# Patient Record
Sex: Male | Born: 1950 | Race: White | Hispanic: No | Marital: Married
Health system: Southern US, Community
[De-identification: ages and names within clinical notes are randomized; demographics above are authoritative.]

---

## 1999-09-30 ENCOUNTER — Emergency Department (HOSPITAL_COMMUNITY): Admission: EM | Admit: 1999-09-30 | Discharge: 1999-09-30 | Payer: Self-pay | Admitting: Emergency Medicine

## 1999-09-30 ENCOUNTER — Encounter: Payer: Self-pay | Admitting: Emergency Medicine

## 2002-02-01 ENCOUNTER — Encounter: Admission: RE | Admit: 2002-02-01 | Discharge: 2002-02-01 | Payer: Self-pay | Admitting: Internal Medicine

## 2002-02-01 ENCOUNTER — Encounter: Payer: Self-pay | Admitting: Internal Medicine

## 2010-02-15 ENCOUNTER — Ambulatory Visit (HOSPITAL_COMMUNITY)
Admission: RE | Admit: 2010-02-15 | Discharge: 2010-02-15 | Payer: Self-pay | Source: Home / Self Care | Attending: Urology | Admitting: Urology

## 2010-02-16 LAB — CREATININE, SERUM
Creatinine, Ser: 0.99 mg/dL (ref 0.4–1.5)
GFR calc Af Amer: >60 mL/min (ref >60)
GFR calc non Af Amer: >60 mL/min (ref >60)

## 2010-03-02 ENCOUNTER — Ambulatory Visit: Payer: Managed Care, Other (non HMO) | Attending: Urology | Admitting: Physical Therapy

## 2010-03-02 DIAGNOSIS — M2420 Disorder of ligament, unspecified site: Secondary | ICD-10-CM | POA: Insufficient documentation

## 2010-03-02 DIAGNOSIS — IMO0001 Reserved for inherently not codable concepts without codable children: Secondary | ICD-10-CM | POA: Insufficient documentation

## 2010-03-02 DIAGNOSIS — M629 Disorder of muscle, unspecified: Secondary | ICD-10-CM | POA: Insufficient documentation

## 2010-03-17 ENCOUNTER — Other Ambulatory Visit: Payer: Self-pay | Admitting: Urology

## 2010-03-17 ENCOUNTER — Other Ambulatory Visit (HOSPITAL_COMMUNITY): Payer: Self-pay | Admitting: Urology

## 2010-03-17 ENCOUNTER — Encounter (HOSPITAL_COMMUNITY): Payer: Managed Care, Other (non HMO)

## 2010-03-17 ENCOUNTER — Ambulatory Visit (HOSPITAL_COMMUNITY)
Admission: RE | Admit: 2010-03-17 | Discharge: 2010-03-17 | Disposition: A | Discharge status: Home / Self Care | Payer: Managed Care, Other (non HMO) | Source: Ambulatory Visit | Attending: Urology | Admitting: Urology

## 2010-03-17 DIAGNOSIS — Z01810 Encounter for preprocedural cardiovascular examination: Secondary | ICD-10-CM | POA: Insufficient documentation

## 2010-03-17 DIAGNOSIS — Z01812 Encounter for preprocedural laboratory examination: Secondary | ICD-10-CM | POA: Insufficient documentation

## 2010-03-17 DIAGNOSIS — Z01811 Encounter for preprocedural respiratory examination: Secondary | ICD-10-CM | POA: Insufficient documentation

## 2010-03-17 DIAGNOSIS — Z01818 Encounter for other preprocedural examination: Secondary | ICD-10-CM | POA: Insufficient documentation

## 2010-03-17 DIAGNOSIS — I440 Atrioventricular block, first degree: Secondary | ICD-10-CM | POA: Insufficient documentation

## 2010-03-17 DIAGNOSIS — C61 Malignant neoplasm of prostate: Secondary | ICD-10-CM

## 2010-03-17 LAB — CBC
MCH: 30.6 pg (ref 26.0–34.0)
MCHC: 34.7 g/dL (ref 30.0–36.0)
Platelets: 270 10*3/uL (ref 150–400)
RBC: 5.2 MIL/uL (ref 4.22–5.81)

## 2010-03-17 LAB — BASIC METABOLIC PANEL
Calcium: 9.6 mg/dL (ref 8.4–10.5)
Creatinine, Ser: 0.81 mg/dL (ref 0.4–1.5)
GFR calc Af Amer: >60 mL/min (ref >60)

## 2010-03-17 LAB — SURGICAL PCR SCREEN
MRSA, PCR: NEGATIVE
Staphylococcus aureus: NEGATIVE

## 2010-03-22 ENCOUNTER — Other Ambulatory Visit: Payer: Self-pay | Admitting: Urology

## 2010-03-22 ENCOUNTER — Inpatient Hospital Stay (HOSPITAL_COMMUNITY)
Admission: RE | Admit: 2010-03-22 | Discharge: 2010-03-23 | DRG: 708 | Disposition: A | Discharge status: Home / Self Care | Payer: Managed Care, Other (non HMO) | Source: Ambulatory Visit | Attending: Urology | Admitting: Urology

## 2010-03-22 DIAGNOSIS — I10 Essential (primary) hypertension: Secondary | ICD-10-CM | POA: Diagnosis present

## 2010-03-22 DIAGNOSIS — C61 Malignant neoplasm of prostate: Principal | ICD-10-CM | POA: Diagnosis present

## 2010-03-22 DIAGNOSIS — Z970 Presence of artificial eye: Secondary | ICD-10-CM

## 2010-03-23 LAB — HEMOGLOBIN AND HEMATOCRIT, BLOOD
HCT: 41.9 % (ref 39.0–52.0)
Hemoglobin: 13.7 g/dL (ref 13.0–17.0)

## 2010-03-27 NOTE — Op Note (Signed)
Nicholas Poole, Nicholas Poole              ACCOUNT NO.:  [id number]  MEDICAL RECORD NO.:  [id number]           PATIENT TYPE:  I  LOCATION:  0009                         FACILITY:  [facility]  PHYSICIAN:  Heloise Purpura, MD      DATE OF BIRTH:  Nov 08, 1950  DATE OF PROCEDURE:  03/22/2010 DATE OF DISCHARGE:                              OPERATIVE REPORT   PREOPERATIVE DIAGNOSIS:  Clinically localized adenocarcinoma of prostate (clinical stage T2a, N0, M0).  POSTOPERATIVE DIAGNOSIS:  Clinically localized adenocarcinoma of prostate (clinical stage T2a, N0, M0).  PROCEDURE: 1. Robotic-assisted laparoscopic radical prostatectomy (left nerve     sparing and partial right nerve sparing). 2. Bilateral robotic-assisted laparoscopic pelvic lymphadenectomy.  SURGEON:  Heloise Purpura, MD  ASSISTANT:  Delia Chimes, NP  ANESTHESIA:  General.  COMPLICATIONS:  None.  ESTIMATED BLOOD LOSS:  100 cc.  INTRAVENOUS FLUIDS:  1500 cc of lactated Ringer's.  SPECIMENS: 1. Prostate and seminal vesicles. 2. Right pelvic lymph nodes. 3. Left pelvic lymph nodes.  DISPOSITION:  Specimen to pathology.  DRAINS: 1. A 20-French Coude catheter. 2. A #19 Blake pelvic drain.  INDICATIONS:  Mr. Choquette is a 60 year old gentleman with clinically localized prostate cancer.  After discussion regarding management options for treatment, he elected to proceed with surgical therapy and the above procedures.  The potential risks, complications, and alternative treatment options were discussed in detail and informed consent was obtained.  DESCRIPTION OF PROCEDURE:  He was taken to the operating room and a general anesthetic was administered.  He was given preoperative antibiotics, placed in the dorsal lithotomy position, and prepped and draped in the usual sterile fashion.  Next preoperative time-out was performed.  A Foley catheter was then inserted into the bladder and a site was selected just to the left of the  umbilicus for placement of the camera port.  This was placed using a standard open Hassan technique which allowed entry into the peritoneal cavity under direct vision without difficulty.  A 12-mm port was then placed and pneumoperitoneum was established.  The abdomen was then inspected and there was no evidence of any intra-abdominal injuries or other abnormalities.  The remaining ports were then placed with 8 mm robotic ports placed in the right lower quadrant, left lower quadrant, and far left lower quadrant. A 5-mm port was placed in the right upper quadrant and a 12-mm port was placed in the far right lateral abdominal wall for laparoscopic assistance.  All ports were placed under direct vision without difficulty.  The surgical cart was then docked.  With the aid of the cautery scissors, the bladder was reflected posteriorly allowing entry into space of Retzius and identification of the endopelvic fascia and prostate.  The endopelvic fascia was incised from the apex back to the base of prostate bilaterally and the underlying levator muscle fibers were swept laterally off the prostate thereby isolating the dorsal venous complex.  The dorsal vein was then stapled and divided with a 45- mm flex echelon stapler.  The bladder neck was then identified with the aid of Foley catheter manipulation and was divided anteriorly.  This exposed the catheter.  The catheter balloon was deflated and the catheter was brought into the operative field and used to retract the prostate anteriorly.  The posterior bladder neck was then divided and dissection proceeded between the bladder and prostate until the vasa deferentia and seminal vesicles were identified.  The vasa deferentia were isolated, divided and lifted anteriorly.  The seminal vesicles were then dissected down to their tips with care to control the seminal vesicle arterial blood supply.  The seminal vesicles were then also lifted anteriorly in  the space between Denonvilliers fascia and the anterior rectum was developed with a combination of sharp and blunt dissection.  This isolated the vascular pedicles of the prostate.  The lateral prostatic fascia was then sharply incised bilaterally allowing the neurovascular bundles to be released.  The vascular pedicles of the prostate were then ligated with Hem-o-lok clips and divided above at the level of the neurovascular bundles with sharp cold scissor dissection. The neurovascular bundles were then swept off the apex of the prostate urethra and the urethra was sharply transected allowing the prostate specimen to be disarticulated.  The pelvis was then copiously irrigated and hemostasis was ensured.  There was no evidence of a rectal injury. There was noted be some arterial bleeding near the distal right neurovascular bundle and this was controlled with figure-of-eight 3-0 Vicryl sutures placed and to provide hemostasis.  Attention then turned to the right pelvic sidewall.  The fibrofatty tissue between the external iliac vein, confluence of the iliac vessels, hypogastric artery, and Cooper's ligament was dissected free from the pelvic sidewall with care to preserve the obturator nerve.  Hem-o-lok clips were used for lymphostasis and hemostasis.  This lymphatic packet was then removed for permanent pathologic analysis and an identical procedure was performed on the contralateral side.  Attention then turned to the urethral anastomosis.  A 2-0 Vicryl slip-knot was placed between Denonvilliers fascia, the posterior bladder neck, and the posterior urethra to reapproximate these structures.  A double-armed 3-0 Monocryl suture was then used to perform a 360-degree running tension- free anastomosis between the bladder neck and urethra.  A new 20-French Coude catheter was inserted into the bladder and irrigated.  There were no blood clots within the bladder and the anastomosis appeared to  be watertight.  A #19 Blake drain was then brought through the left robotic port and positioned appropriately in the pelvis.  It was secured to skin with a nylon suture.  The surgical cart was then undocked.  The right lateral 12-mm port site was then closed with a 0 Vicryl suture placed laparoscopically.  All remaining ports were removed under direct vision and the prostate specimen was removed intact within the Endopouch retrieval bag via the periumbilical port site.  This fascial opening was then closed with 2 running 0 Vicryl sutures.  All port sites were injected with 0.25% Marcaine and reapproximated at the skin level with staples.  Sterile dressings were applied.  The patient appeared to tolerate the procedure well and without complications.  He was able to be extubated, transferred to the recovery unit in satisfactory condition.     Heloise Purpura, MD     LB/MEDQ  D:  03/22/2010  T:  03/22/2010  Job:  [id number]  Electronically Signed by Heloise Purpura MD on 03/27/2010 08:03:56 AM

## 2010-04-15 ENCOUNTER — Ambulatory Visit: Payer: Managed Care, Other (non HMO) | Attending: Urology | Admitting: Physical Therapy

## 2010-04-15 DIAGNOSIS — IMO0001 Reserved for inherently not codable concepts without codable children: Secondary | ICD-10-CM | POA: Insufficient documentation

## 2010-04-15 DIAGNOSIS — M2420 Disorder of ligament, unspecified site: Secondary | ICD-10-CM | POA: Insufficient documentation

## 2010-04-15 DIAGNOSIS — M629 Disorder of muscle, unspecified: Secondary | ICD-10-CM | POA: Insufficient documentation

## 2010-04-22 ENCOUNTER — Ambulatory Visit: Payer: Managed Care, Other (non HMO) | Admitting: Physical Therapy

## 2010-05-20 ENCOUNTER — Ambulatory Visit: Payer: Managed Care, Other (non HMO) | Attending: Urology | Admitting: Physical Therapy

## 2010-05-20 DIAGNOSIS — M629 Disorder of muscle, unspecified: Secondary | ICD-10-CM | POA: Insufficient documentation

## 2010-05-20 DIAGNOSIS — M2420 Disorder of ligament, unspecified site: Secondary | ICD-10-CM | POA: Insufficient documentation

## 2010-05-20 DIAGNOSIS — IMO0001 Reserved for inherently not codable concepts without codable children: Secondary | ICD-10-CM | POA: Insufficient documentation

## 2010-06-10 ENCOUNTER — Encounter: Payer: Managed Care, Other (non HMO) | Admitting: Physical Therapy

## 2010-06-11 NOTE — Consult Note (Signed)
[facility]  Patient:    Nicholas Poole, Nicholas Poole                       MRN: [id number] Proc. Date: 09/30/99 Attending:  Artist Pais. Mina Marble, M.D.                          Consultation Report  REFERRING PHYSICIAN:  Smitty Cords. Beverely Pace, M.D.  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  He is otherwise a fairly healthy 60 year old male.  He presents today with significant pain and deformity and wounds over the volar aspect of his left long and ring fingers.  ALLERGIES:  No known drug allergies.  MEDICATIONS:  Verapamil 240 slow release for hypertension, which is controlled quite well over the past few years.  PAST MEDICAL HISTORY:  Noncontributory.  PAST SURGICAL HISTORY:  Noncontributory.  FAMILY HISTORY:  Noncontributory.  SOCIAL HISTORY:  Noncontributory.  PHYSICAL EXAMINATION:  GENERAL:  A well-developed, well-nourished male, pleasant, alert and oriented x 3.  EXTREMITIES:  Examination of the hand on the left shows an open wound over the distal aspect of his long finger with loss of the nail matrix and the nail plate about the distal third, with exposed distal phalangeal bone.  Also has a fairly complex laceration to the volar aspect of the ring finger at the volar pad with no exposed bone or tendon.  There is a significant amount of foreign material, including grass, within both of the wounds.  He has a 2+ radial pulses, brisk capillary refill otherwise.  Skin is otherwise intact with no erythema or signs of infection.  He can make a positive fist.  Full tendon function.  No instability noted at the finger joints.  DIAGNOSTIC EVALUATION:  X-rays in the emergency department showed an open fracture of the distal  phalanx, left long finger.  IMPRESSION:  A 60 year old male with open fracture, tip amputation of left long finger, contaminated wound left finger finger.  DESCRIPTION OF PROCEDURE:  The patient was anesthetized with 2% plain lidocaine.  Once adequate anesthesia was obtained, he was cleaned with Betadine soap and saline solution, and his wounds were debrided.  Once they were debrided, a volar V-Y advancement flap was advanced over the long finger after debridement of nonviable material, including grass and dirt.  The flap was advanced and was sewn to the nail plate using 4-0 nylon.  Once the V-Y advancement flap had been finished, the wound on the volar aspect of the ring finger was freed of clot and nonviable material, including dirt and grass. This was dressed with Xeroform for secondary intention healing.  Both wounds were then dressed with 4 x 4s, fluffs, and Coban wrap.  DISPOSITION:  The patient was then discharged from the emergency department with Keflex 500 mg one p.o. q.i.d. for a week for antibiotic prophylaxis, as well as Vicodin #20 for pain.  He was to follow up in my office on October 05, 1999. DD:  09/30/99 TD:  10/01/99 Job: [id number]

## 2010-09-30 ENCOUNTER — Other Ambulatory Visit: Payer: Self-pay | Admitting: Gastroenterology

## 2010-10-22 ENCOUNTER — Emergency Department (HOSPITAL_COMMUNITY)
Admission: EM | Admit: 2010-10-22 | Discharge: 2010-10-22 | Disposition: A | Discharge status: Home / Self Care | Payer: Managed Care, Other (non HMO) | Attending: Emergency Medicine | Admitting: Emergency Medicine

## 2010-10-22 ENCOUNTER — Emergency Department (HOSPITAL_COMMUNITY): Payer: Managed Care, Other (non HMO)

## 2010-10-22 DIAGNOSIS — S0990XA Unspecified injury of head, initial encounter: Secondary | ICD-10-CM | POA: Insufficient documentation

## 2010-10-22 DIAGNOSIS — Z8546 Personal history of malignant neoplasm of prostate: Secondary | ICD-10-CM | POA: Insufficient documentation

## 2010-10-22 DIAGNOSIS — I10 Essential (primary) hypertension: Secondary | ICD-10-CM | POA: Insufficient documentation

## 2010-10-22 DIAGNOSIS — IMO0002 Reserved for concepts with insufficient information to code with codable children: Secondary | ICD-10-CM | POA: Insufficient documentation

## 2010-10-22 DIAGNOSIS — M25519 Pain in unspecified shoulder: Secondary | ICD-10-CM | POA: Insufficient documentation

## 2010-10-22 DIAGNOSIS — S40019A Contusion of unspecified shoulder, initial encounter: Secondary | ICD-10-CM | POA: Insufficient documentation

## 2010-10-22 DIAGNOSIS — S0003XA Contusion of scalp, initial encounter: Secondary | ICD-10-CM | POA: Insufficient documentation

## 2010-12-22 ENCOUNTER — Other Ambulatory Visit: Payer: Self-pay | Admitting: Dermatology

## 2014-10-24 DIAGNOSIS — Z23 Encounter for immunization: Secondary | ICD-10-CM | POA: Diagnosis not present

## 2014-10-24 DIAGNOSIS — D075 Carcinoma in situ of prostate: Secondary | ICD-10-CM | POA: Diagnosis not present

## 2014-10-24 DIAGNOSIS — I10 Essential (primary) hypertension: Secondary | ICD-10-CM | POA: Diagnosis not present

## 2014-11-02 DIAGNOSIS — D122 Benign neoplasm of ascending colon: Secondary | ICD-10-CM | POA: Diagnosis not present

## 2014-11-06 DIAGNOSIS — Z86010 Personal history of colonic polyps: Secondary | ICD-10-CM | POA: Diagnosis not present

## 2014-11-06 DIAGNOSIS — D129 Benign neoplasm of anus and anal canal: Secondary | ICD-10-CM | POA: Diagnosis not present

## 2014-11-12 ENCOUNTER — Other Ambulatory Visit: Payer: Self-pay | Admitting: Gastroenterology

## 2014-11-12 DIAGNOSIS — Z09 Encounter for follow-up examination after completed treatment for conditions other than malignant neoplasm: Secondary | ICD-10-CM | POA: Diagnosis not present

## 2014-11-12 DIAGNOSIS — D122 Benign neoplasm of ascending colon: Secondary | ICD-10-CM | POA: Diagnosis not present

## 2014-11-12 DIAGNOSIS — Z86010 Personal history of colonic polyps: Secondary | ICD-10-CM | POA: Diagnosis not present

## 2014-11-12 DIAGNOSIS — K648 Other hemorrhoids: Secondary | ICD-10-CM | POA: Diagnosis not present

## 2015-02-08 DIAGNOSIS — J3089 Other allergic rhinitis: Secondary | ICD-10-CM | POA: Diagnosis not present

## 2015-02-08 DIAGNOSIS — J019 Acute sinusitis, unspecified: Secondary | ICD-10-CM | POA: Diagnosis not present

## 2015-02-12 DIAGNOSIS — J018 Other acute sinusitis: Secondary | ICD-10-CM | POA: Diagnosis not present

## 2015-02-13 DIAGNOSIS — C61 Malignant neoplasm of prostate: Secondary | ICD-10-CM | POA: Diagnosis not present

## 2015-02-20 DIAGNOSIS — Z0000 Encounter for general adult medical examination without abnormal findings: Secondary | ICD-10-CM | POA: Diagnosis not present

## 2015-02-20 DIAGNOSIS — C61 Malignant neoplasm of prostate: Secondary | ICD-10-CM | POA: Diagnosis not present

## 2015-09-15 DIAGNOSIS — Z4421 Encounter for fitting and adjustment of artificial right eye: Secondary | ICD-10-CM | POA: Diagnosis not present

## 2015-09-16 DIAGNOSIS — C61 Malignant neoplasm of prostate: Secondary | ICD-10-CM | POA: Diagnosis not present

## 2015-09-23 ENCOUNTER — Other Ambulatory Visit (HOSPITAL_COMMUNITY): Payer: Self-pay | Admitting: Urology

## 2015-09-23 DIAGNOSIS — C61 Malignant neoplasm of prostate: Secondary | ICD-10-CM | POA: Diagnosis not present

## 2015-09-30 ENCOUNTER — Ambulatory Visit (HOSPITAL_COMMUNITY)
Admission: RE | Admit: 2015-09-30 | Discharge: 2015-09-30 | Disposition: A | Discharge status: Home / Self Care | Payer: Medicare Other | Source: Ambulatory Visit | Attending: Urology | Admitting: Urology

## 2015-09-30 ENCOUNTER — Encounter (HOSPITAL_COMMUNITY)
Admission: RE | Admit: 2015-09-30 | Discharge: 2015-09-30 | Disposition: A | Discharge status: Home / Self Care | Payer: Medicare Other | Source: Ambulatory Visit | Attending: Urology | Admitting: Urology

## 2015-09-30 DIAGNOSIS — C61 Malignant neoplasm of prostate: Secondary | ICD-10-CM | POA: Diagnosis not present

## 2015-10-08 ENCOUNTER — Other Ambulatory Visit (HOSPITAL_COMMUNITY): Payer: Self-pay | Admitting: Urology

## 2015-10-08 DIAGNOSIS — D376 Neoplasm of uncertain behavior of liver, gallbladder and bile ducts: Secondary | ICD-10-CM

## 2015-10-15 ENCOUNTER — Ambulatory Visit (HOSPITAL_COMMUNITY)
Admission: RE | Admit: 2015-10-15 | Discharge: 2015-10-15 | Disposition: A | Discharge status: Home / Self Care | Payer: Medicare Other | Source: Ambulatory Visit | Attending: Urology | Admitting: Urology

## 2015-10-15 DIAGNOSIS — K7689 Other specified diseases of liver: Secondary | ICD-10-CM | POA: Insufficient documentation

## 2015-10-15 DIAGNOSIS — D376 Neoplasm of uncertain behavior of liver, gallbladder and bile ducts: Secondary | ICD-10-CM | POA: Diagnosis not present

## 2015-10-15 DIAGNOSIS — D1803 Hemangioma of intra-abdominal structures: Secondary | ICD-10-CM | POA: Diagnosis not present

## 2015-10-15 MED ORDER — GADOBENATE DIMEGLUMINE 529 MG/ML IV SOLN
16.0000 mL | Freq: Once | INTRAVENOUS | Status: AC | PRN
  Administered 2015-10-15: 16 mL via INTRAVENOUS

## 2015-11-11 DIAGNOSIS — I10 Essential (primary) hypertension: Secondary | ICD-10-CM | POA: Diagnosis not present

## 2015-11-11 DIAGNOSIS — D075 Carcinoma in situ of prostate: Secondary | ICD-10-CM | POA: Diagnosis not present

## 2015-11-11 DIAGNOSIS — Z0000 Encounter for general adult medical examination without abnormal findings: Secondary | ICD-10-CM | POA: Diagnosis not present

## 2015-12-04 DIAGNOSIS — J0141 Acute recurrent pansinusitis: Secondary | ICD-10-CM | POA: Diagnosis not present

## 2016-02-02 DIAGNOSIS — R0981 Nasal congestion: Secondary | ICD-10-CM | POA: Diagnosis not present

## 2016-02-02 DIAGNOSIS — J329 Chronic sinusitis, unspecified: Secondary | ICD-10-CM | POA: Diagnosis not present

## 2016-03-16 DIAGNOSIS — C61 Malignant neoplasm of prostate: Secondary | ICD-10-CM | POA: Diagnosis not present

## 2016-03-23 DIAGNOSIS — C61 Malignant neoplasm of prostate: Secondary | ICD-10-CM | POA: Diagnosis not present

## 2016-10-31 ENCOUNTER — Other Ambulatory Visit (HOSPITAL_COMMUNITY): Payer: Self-pay | Admitting: Urology

## 2016-10-31 DIAGNOSIS — C61 Malignant neoplasm of prostate: Secondary | ICD-10-CM

## 2016-11-11 ENCOUNTER — Encounter (HOSPITAL_COMMUNITY)
Admission: RE | Admit: 2016-11-11 | Discharge: 2016-11-11 | Disposition: A | Discharge status: Home / Self Care | Payer: Medicare Other | Source: Ambulatory Visit | Attending: Urology | Admitting: Urology

## 2016-11-11 DIAGNOSIS — C61 Malignant neoplasm of prostate: Secondary | ICD-10-CM | POA: Insufficient documentation

## 2016-11-11 MED ORDER — AXUMIN (FLUCICLOVINE F 18) INJECTION
9.5000 | Freq: Once | INTRAVENOUS | Status: AC
  Administered 2016-11-11: 9.5 via INTRAVENOUS

## 2018-08-12 IMAGING — NM NM BONE WHOLE BODY
2 series · 2 of 2 positions shown · non-contrast
Comparison: 02/15/2010

Correlation: CT abdomen and pelvis 09/30/2015

CLINICAL DATA: Prostate cancer, post prostatectomy in 3813

EXAM:
NUCLEAR MEDICINE WHOLE BODY BONE SCAN
TECHNIQUE: Whole body anterior and posterior images were obtained approximately
3 hours after intravenous injection of radiopharmaceutical.
RADIOPHARMACEUTICALS:  20.2 mCi 8echnetium-99m MDP IV

[Series 1: wbr_bone_40 whole body · 2.66mm/px · 1 of 1 slices shown (1 of 2)]
[im 1/1]
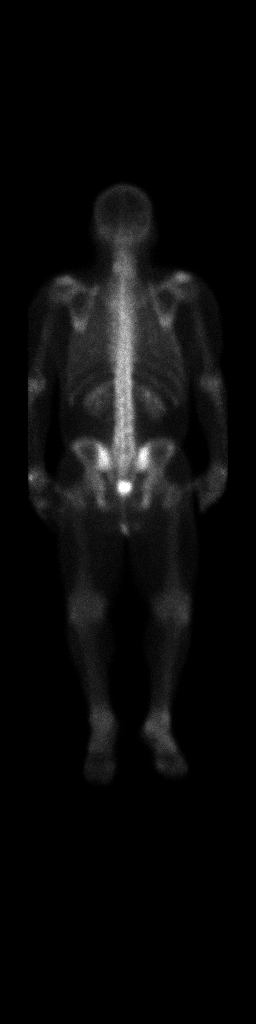

[Series 1: wbr_bone_40 whole body · 2.66mm/px · 1 of 1 slices shown (2 of 2)]
[im 1/1]
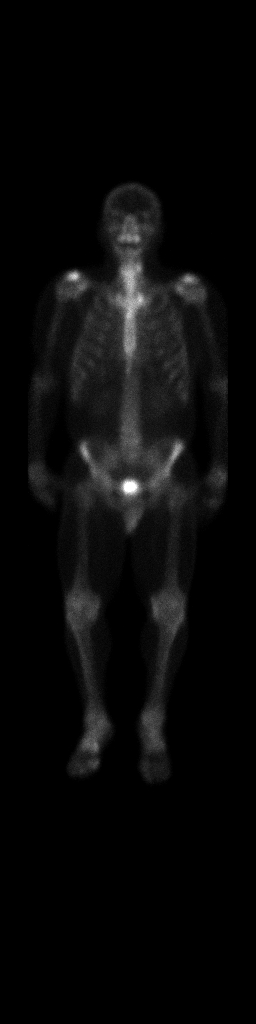

[2 of 2 positions shown; findings below may reference images not displayed]

FINDINGS: Uptake at the shoulders, wrists, cervical spine, and RIGHT foot,
typically degenerative.

Cervical spine and shoulder uptake is unchanged.

No definite abnormal sites of increased osseous tracer accumulation
are identified to suggest metastatic prostate cancer.

Expected urinary tract and soft tissue distribution of tracer.
IMPRESSION: No scintigraphic evidence of osseous metastatic disease.

## 2019-03-09 ENCOUNTER — Ambulatory Visit: Payer: Medicare Other | Attending: Internal Medicine

## 2019-03-09 DIAGNOSIS — Z23 Encounter for immunization: Secondary | ICD-10-CM

## 2019-03-09 NOTE — Progress Notes (Signed)
   Covid-19 Vaccination Clinic  Name:  ESA SANDERLIN    MRN: [id number] DOB: Oct 02, 1950  03/09/2019  Mr. Zettler was observed post Covid-19 immunization for 15 minutes without incidence. He was provided with Vaccine Information Sheet and instruction to access the V-Safe system.   Mr. Dever was instructed to call 911 with any severe reactions post vaccine: Marland Kitchen Difficulty breathing  . Swelling of your face and throat  . A fast heartbeat  . A bad rash all over your body  . Dizziness and weakness    Immunizations Administered    Name Date Dose VIS Date Route   Pfizer COVID-19 Vaccine 03/09/2019  8:56 AM 0.3 mL 01/04/2019 Intramuscular   Manufacturer: [place]   Lot: [id number]   [place]: [id number]

## 2019-04-01 ENCOUNTER — Ambulatory Visit: Payer: Medicare Other | Attending: Internal Medicine

## 2019-04-01 DIAGNOSIS — Z23 Encounter for immunization: Secondary | ICD-10-CM | POA: Insufficient documentation

## 2019-04-01 NOTE — Progress Notes (Signed)
   Covid-19 Vaccination Clinic  Name:  Nicholas Poole    MRN: [id number] DOB: December 10, 1950  04/01/2019  Nicholas Poole was observed post Covid-19 immunization for 15 minutes without incident. He was provided with Vaccine Information Sheet and instruction to access the V-Safe system.   Nicholas Poole was instructed to call 911 with any severe reactions post vaccine: Marland Kitchen Difficulty breathing  . Swelling of face and throat  . A fast heartbeat  . A bad rash all over body  . Dizziness and weakness   Immunizations Administered    Name Date Dose VIS Date Route   Pfizer COVID-19 Vaccine 04/01/2019  9:29 AM 0.3 mL 01/04/2019 Intramuscular   Manufacturer: [place]   Lot: [id number]   [place]: [id number]

## 2019-09-24 IMAGING — CT NM PET NOPR SKULL BASE TO THIGH
1 of 8 series · 1 of 25 positions shown · non-contrast
Comparison: Abdominal MRI 10/15/2015, bone scan 09/30/2015, CT 9
617

CLINICAL DATA: Prostate carcinoma with biochemical recurrence. PSA
equal

EXAM:
NUCLEAR MEDICINE PET SKULL BASE TO THIGH
TECHNIQUE: 9.5 mCi F-18 Fluciclovine was injected intravenously. Full-ring PET
imaging was performed from the skull base to thigh after the
radiotracer. CT data was obtained and used for attenuation
correction and anatomic localization.

[Series 4: ct sk_thigh 5.0 b31f · axial · 5.0mm · 0.90mm/px · 1 of 225 slices shown]
[im 225/225  brain]
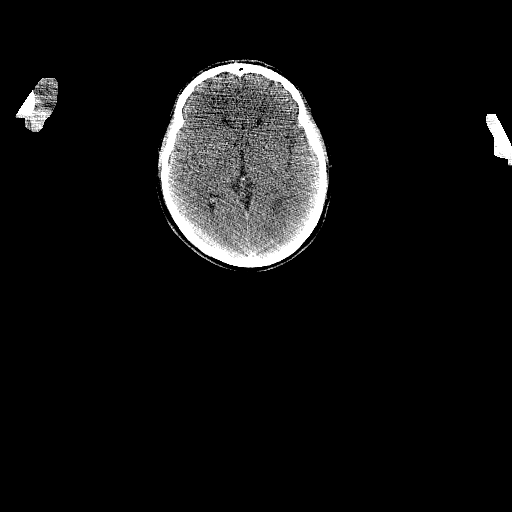

[1 of 25 positions shown; findings below may reference images not displayed]

FINDINGS: NECK

No radiotracer activity in neck lymph nodes.

CHEST

No radiotracer accumulation within mediastinal or hilar lymph nodes.
Small RIGHT upper lobe pulmonary nodule measures 3 mm (image 45,
series 3).

ABDOMEN/PELVIS

No focal radiotracer activity within the prostate bed.

No radiotracer activity in periaortic lymph nodes. No enlarged lymph
nodes identified.

One lymph node along the LEFT pelvic sidewall adjacent to the
operator muscle measures 6 mm (image 26, series 4) increased from 3
mm on prior. This nodule has faint radiotracer activity (SUV max
equal 1.0.

Physiologic activity noted within the liver and pancreas. Benign
hepatic cysts in LEFT hepatic lobe

SKELETON

No focal  activity to suggest skeletal metastasis.
IMPRESSION: 1. No clear evidence of local prostate cancer recurrence in the
pelvis.
2. One deep LEFT pelvic lymph node is increased in size but only
measures 6 mm. Faint radiotracer activity in the small lymph node is
nonspecific. No evidence of metastatic disease outside the pelvis.
3. No skeletal metastasis.
4. Small RIGHT upper lobe pulmonary nodule is favored benign.

## 2019-11-05 ENCOUNTER — Ambulatory Visit: Payer: Self-pay | Admitting: Nurse Practitioner

## 2019-12-07 ENCOUNTER — Other Ambulatory Visit: Payer: Self-pay

## 2019-12-07 ENCOUNTER — Ambulatory Visit: Payer: Medicare Other | Attending: Internal Medicine

## 2019-12-07 DIAGNOSIS — Z23 Encounter for immunization: Secondary | ICD-10-CM

## 2019-12-07 NOTE — Progress Notes (Signed)
   Covid-19 Vaccination Clinic  Name:  SHARONE ALMOND    MRN: [id number] DOB: September 12, 1950  12/07/2019  Mr. Philipson was observed post Covid-19 immunization for 15 minutes without incident. He was provided with Vaccine Information Sheet and instruction to access the V-Safe system.   Mr. Noreen was instructed to call 911 with any severe reactions post vaccine: Marland Kitchen Difficulty breathing  . Swelling of face and throat  . A fast heartbeat  . A bad rash all over body  . Dizziness and weakness   Immunizations Administered    Name Date Dose VIS Date Route   Pfizer COVID-19 Vaccine 12/07/2019 11:54 AM 0.3 mL 11/13/2019 Intramuscular   Manufacturer: [place]   Lot: [id number]   [place]: [id number]

## 2019-12-28 ENCOUNTER — Ambulatory Visit: Payer: Medicare Other

## 2022-02-07 ENCOUNTER — Other Ambulatory Visit (HOSPITAL_COMMUNITY): Payer: Self-pay | Admitting: Urology

## 2022-02-07 DIAGNOSIS — C61 Malignant neoplasm of prostate: Secondary | ICD-10-CM

## 2022-02-21 ENCOUNTER — Encounter (HOSPITAL_COMMUNITY)
Admission: RE | Admit: 2022-02-21 | Discharge: 2022-02-21 | Disposition: A | Discharge status: Home / Self Care | Payer: Medicare Other | Source: Ambulatory Visit | Attending: Urology | Admitting: Urology

## 2022-02-21 DIAGNOSIS — C61 Malignant neoplasm of prostate: Secondary | ICD-10-CM | POA: Diagnosis not present

## 2022-02-21 MED ORDER — PIFLIFOLASTAT F 18 (PYLARIFY) INJECTION
9.0000 | Freq: Once | INTRAVENOUS | Status: AC
  Administered 2022-02-21: 8.92 via INTRAVENOUS
